# Patient Record
Sex: Female | Born: 1935 | Race: Black or African American | Hispanic: No | Marital: Single | Smoking: Former smoker
Health system: Southern US, Community
[De-identification: ages and names within clinical notes are randomized; demographics above are authoritative.]

## PROBLEM LIST (undated history)

## (undated) DIAGNOSIS — J449 Chronic obstructive pulmonary disease, unspecified: Secondary | ICD-10-CM

## (undated) DIAGNOSIS — Z87891 Personal history of nicotine dependence: Secondary | ICD-10-CM

## (undated) DIAGNOSIS — I10 Essential (primary) hypertension: Secondary | ICD-10-CM

## (undated) DIAGNOSIS — J45909 Unspecified asthma, uncomplicated: Secondary | ICD-10-CM

## (undated) DIAGNOSIS — R0789 Other chest pain: Secondary | ICD-10-CM

---

## 2011-09-08 DIAGNOSIS — R0602 Shortness of breath: Secondary | ICD-10-CM

## 2011-11-06 DIAGNOSIS — R0602 Shortness of breath: Secondary | ICD-10-CM

## 2011-11-07 DIAGNOSIS — R0602 Shortness of breath: Secondary | ICD-10-CM

## 2013-11-17 ENCOUNTER — Encounter (HOSPITAL_COMMUNITY): Payer: Self-pay | Admitting: Nurse Practitioner

## 2013-11-17 ENCOUNTER — Encounter (HOSPITAL_COMMUNITY)
Admission: EM | Disposition: A | Discharge status: Home / Self Care | Payer: Self-pay | Source: Other Acute Inpatient Hospital | Attending: Interventional Cardiology

## 2013-11-17 ENCOUNTER — Inpatient Hospital Stay (HOSPITAL_COMMUNITY)
Admission: EM | Admit: 2013-11-17 | Discharge: 2013-11-19 | DRG: 191 | Disposition: A | Discharge status: Home / Self Care | Payer: Medicare FFS | Source: Other Acute Inpatient Hospital | Attending: Interventional Cardiology | Admitting: Interventional Cardiology

## 2013-11-17 DIAGNOSIS — J441 Chronic obstructive pulmonary disease with (acute) exacerbation: Secondary | ICD-10-CM | POA: Diagnosis present

## 2013-11-17 DIAGNOSIS — Z823 Family history of stroke: Secondary | ICD-10-CM | POA: Diagnosis not present

## 2013-11-17 DIAGNOSIS — K219 Gastro-esophageal reflux disease without esophagitis: Secondary | ICD-10-CM | POA: Diagnosis present

## 2013-11-17 DIAGNOSIS — Z87891 Personal history of nicotine dependence: Secondary | ICD-10-CM

## 2013-11-17 DIAGNOSIS — R062 Wheezing: Secondary | ICD-10-CM | POA: Diagnosis present

## 2013-11-17 DIAGNOSIS — I10 Essential (primary) hypertension: Secondary | ICD-10-CM | POA: Diagnosis present

## 2013-11-17 DIAGNOSIS — J45901 Unspecified asthma with (acute) exacerbation: Secondary | ICD-10-CM | POA: Diagnosis present

## 2013-11-17 DIAGNOSIS — Z8042 Family history of malignant neoplasm of prostate: Secondary | ICD-10-CM | POA: Diagnosis not present

## 2013-11-17 DIAGNOSIS — Z8249 Family history of ischemic heart disease and other diseases of the circulatory system: Secondary | ICD-10-CM | POA: Diagnosis not present

## 2013-11-17 DIAGNOSIS — J449 Chronic obstructive pulmonary disease, unspecified: Secondary | ICD-10-CM

## 2013-11-17 DIAGNOSIS — J440 Chronic obstructive pulmonary disease with acute lower respiratory infection: Secondary | ICD-10-CM | POA: Diagnosis present

## 2013-11-17 DIAGNOSIS — I2510 Atherosclerotic heart disease of native coronary artery without angina pectoris: Secondary | ICD-10-CM | POA: Diagnosis present

## 2013-11-17 DIAGNOSIS — Z880 Allergy status to penicillin: Secondary | ICD-10-CM

## 2013-11-17 DIAGNOSIS — Z9981 Dependence on supplemental oxygen: Secondary | ICD-10-CM | POA: Diagnosis not present

## 2013-11-17 DIAGNOSIS — Z23 Encounter for immunization: Secondary | ICD-10-CM | POA: Diagnosis not present

## 2013-11-17 DIAGNOSIS — R079 Chest pain, unspecified: Secondary | ICD-10-CM | POA: Diagnosis present

## 2013-11-17 DIAGNOSIS — J209 Acute bronchitis, unspecified: Secondary | ICD-10-CM | POA: Diagnosis present

## 2013-11-17 DIAGNOSIS — I200 Unstable angina: Secondary | ICD-10-CM | POA: Insufficient documentation

## 2013-11-17 DIAGNOSIS — R0789 Other chest pain: Secondary | ICD-10-CM

## 2013-11-17 HISTORY — DX: Unspecified asthma, uncomplicated: J45.909

## 2013-11-17 HISTORY — DX: Personal history of nicotine dependence: Z87.891

## 2013-11-17 HISTORY — DX: Other chest pain: R07.89

## 2013-11-17 HISTORY — DX: Chronic obstructive pulmonary disease, unspecified: J44.9

## 2013-11-17 HISTORY — DX: Essential (primary) hypertension: I10

## 2013-11-17 HISTORY — PX: LEFT HEART CATHETERIZATION WITH CORONARY ANGIOGRAM: SHX5451

## 2013-11-17 LAB — POCT ACTIVATED CLOTTING TIME
ACTIVATED CLOTTING TIME: 202 s
ACTIVATED CLOTTING TIME: 163 s

## 2013-11-17 LAB — MRSA PCR SCREENING: MRSA BY PCR: NEGATIVE

## 2013-11-17 LAB — TSH: TSH: 0.779 u[IU]/mL (ref 0.350–4.500)

## 2013-11-17 LAB — TROPONIN I
Troponin I: <0.3 ng/mL (ref <0.30)
Troponin I: <0.3 ng/mL (ref <0.30)

## 2013-11-17 SURGERY — LEFT HEART CATHETERIZATION WITH CORONARY ANGIOGRAM
Anesthesia: LOCAL

## 2013-11-17 MED ORDER — NITROGLYCERIN 1 MG/10 ML FOR IR/CATH LAB
INTRA_ARTERIAL | Status: AC
  Filled 2013-11-17: qty 10

## 2013-11-17 MED ORDER — INDOMETHACIN 50 MG PO CAPS
50.0000 mg | ORAL_CAPSULE | Freq: Two times a day (BID) | ORAL | Status: DC
  Administered 2013-11-17 – 2013-11-18 (×3): 50 mg via ORAL
  Filled 2013-11-17 (×4): qty 1

## 2013-11-17 MED ORDER — PANTOPRAZOLE SODIUM 40 MG PO TBEC
40.0000 mg | DELAYED_RELEASE_TABLET | Freq: Every day | ORAL | Status: DC
  Administered 2013-11-18 – 2013-11-19 (×2): 40 mg via ORAL
  Filled 2013-11-17 (×2): qty 1

## 2013-11-17 MED ORDER — ASPIRIN EC 81 MG PO TBEC
81.0000 mg | DELAYED_RELEASE_TABLET | Freq: Every day | ORAL | Status: DC
Start: 2013-11-18 — End: 2013-11-17

## 2013-11-17 MED ORDER — ALBUTEROL SULFATE (2.5 MG/3ML) 0.083% IN NEBU
3.0000 mL | INHALATION_SOLUTION | RESPIRATORY_TRACT | Status: DC | PRN
  Administered 2013-11-18 (×2): 3 mL via RESPIRATORY_TRACT
  Filled 2013-11-17 (×2): qty 3

## 2013-11-17 MED ORDER — ONDANSETRON HCL 4 MG/2ML IJ SOLN: 4.0000 mg | Freq: Four times a day (QID) | INTRAMUSCULAR | Status: DC | PRN

## 2013-11-17 MED ORDER — LIDOCAINE HCL (PF) 1 % IJ SOLN
INTRAMUSCULAR | Status: AC
  Filled 2013-11-17: qty 30

## 2013-11-17 MED ORDER — INFLUENZA VAC SPLIT QUAD 0.5 ML IM SUSY
0.5000 mL | PREFILLED_SYRINGE | INTRAMUSCULAR | Status: AC
  Administered 2013-11-18: 0.5 mL via INTRAMUSCULAR
  Filled 2013-11-17 (×2): qty 0.5

## 2013-11-17 MED ORDER — CETYLPYRIDINIUM CHLORIDE 0.05 % MT LIQD
7.0000 mL | Freq: Two times a day (BID) | OROMUCOSAL | Status: DC
  Administered 2013-11-17 – 2013-11-19 (×3): 7 mL via OROMUCOSAL

## 2013-11-17 MED ORDER — ACETAMINOPHEN 325 MG PO TABS: 650.0000 mg | ORAL_TABLET | ORAL | Status: DC | PRN

## 2013-11-17 MED ORDER — SODIUM CHLORIDE 0.9 % IJ SOLN
3.0000 mL | Freq: Two times a day (BID) | INTRAMUSCULAR | Status: DC
  Administered 2013-11-18: 10:00:00 via INTRAVENOUS
  Administered 2013-11-18 – 2013-11-19 (×2): 3 mL via INTRAVENOUS

## 2013-11-17 MED ORDER — SODIUM CHLORIDE 0.9 % IJ SOLN: 3.0000 mL | INTRAMUSCULAR | Status: DC | PRN

## 2013-11-17 MED ORDER — VERAPAMIL HCL 2.5 MG/ML IV SOLN
INTRAVENOUS | Status: AC
  Filled 2013-11-17: qty 2

## 2013-11-17 MED ORDER — ATORVASTATIN CALCIUM 10 MG PO TABS
10.0000 mg | ORAL_TABLET | Freq: Every day | ORAL | Status: DC
  Administered 2013-11-17 – 2013-11-18 (×2): 10 mg via ORAL
  Filled 2013-11-17 (×3): qty 1

## 2013-11-17 MED ORDER — FENTANYL CITRATE 0.05 MG/ML IJ SOLN
INTRAMUSCULAR | Status: AC
  Filled 2013-11-17: qty 2

## 2013-11-17 MED ORDER — ATROPINE SULFATE 0.1 MG/ML IJ SOLN
INTRAMUSCULAR | Status: AC
  Filled 2013-11-17: qty 10

## 2013-11-17 MED ORDER — ACETAMINOPHEN 325 MG PO TABS
650.0000 mg | ORAL_TABLET | ORAL | Status: DC | PRN
  Administered 2013-11-17 – 2013-11-18 (×2): 650 mg via ORAL
  Filled 2013-11-17 (×2): qty 2

## 2013-11-17 MED ORDER — HEPARIN (PORCINE) IN NACL 2-0.9 UNIT/ML-% IJ SOLN
INTRAMUSCULAR | Status: AC
  Filled 2013-11-17: qty 1000

## 2013-11-17 MED ORDER — HEPARIN SODIUM (PORCINE) 5000 UNIT/ML IJ SOLN
5000.0000 [IU] | Freq: Three times a day (TID) | INTRAMUSCULAR | Status: DC
  Administered 2013-11-17 – 2013-11-19 (×5): 5000 [IU] via SUBCUTANEOUS
  Filled 2013-11-17 (×8): qty 1

## 2013-11-17 MED ORDER — MIDAZOLAM HCL 2 MG/2ML IJ SOLN
INTRAMUSCULAR | Status: AC
  Filled 2013-11-17: qty 2

## 2013-11-17 MED ORDER — NITROGLYCERIN 0.4 MG SL SUBL: 0.4000 mg | SUBLINGUAL_TABLET | SUBLINGUAL | Status: DC | PRN

## 2013-11-17 MED ORDER — SODIUM CHLORIDE 0.9 % IV SOLN
INTRAVENOUS | Status: DC
  Administered 2013-11-17 (×2): via INTRAVENOUS

## 2013-11-17 MED ORDER — SODIUM CHLORIDE 0.9 % IV SOLN: 250.0000 mL | INTRAVENOUS | Status: DC | PRN

## 2013-11-17 MED ORDER — ASPIRIN 81 MG PO CHEW
81.0000 mg | CHEWABLE_TABLET | Freq: Every day | ORAL | Status: DC
  Administered 2013-11-17 – 2013-11-19 (×3): 81 mg via ORAL
  Filled 2013-11-17 (×3): qty 1

## 2013-11-17 NOTE — Progress Notes (Signed)
eLink Physician-Brief Progress Note Patient Name: MITCHELLE SULTAN DOB: [birth year] MRN: [id number]   Date of Service  11/17/2013  HPI/Events of Note  Pt seen post cath . Neg cath, prob pericarditis  eICU Interventions  Pt stable from elink standpoint      Intervention Category Evaluation Type: New Patient Evaluation  Shan Levans 11/17/2013, 5:44 PM

## 2013-11-17 NOTE — Progress Notes (Signed)
Right femoral arterial sheath removed at 18:30, pressure held for 20 minutes until hemostasis was achieved; site level 0 & pedal pulses are equal/palpabable +1.  Patient instructed to keep her right leg straight, brace her right groin if she needs to cough/sneeze/laugh and to keep her head on her pillow. Will continue to monitor closely. [employer], Mitzi Hansen

## 2013-11-17 NOTE — H&P (Signed)
Patient ID: Olivia Russo MRN: [id number], DOB/AGE: [birth year]   Admit date: 11/17/2013  Primary Physician: Dr. Martie Lee, [place]. Primary Cardiologist: new - seen by Catalina Gravel, MD - should f/u in [place].  Pt. Profile:  78 y/o female w/o prior cardiac hx who presented today with diffuse ST elevation and chest pain.  Problem List  Past Medical History  Diagnosis Date  . HTN (hypertension)   . COPD (chronic obstructive pulmonary disease)     a. On home O2.  . Asthma   . History of tobacco abuse     a. 40-50 pack years, quit ~ 2009.    No past surgical history on file.   Allergies  Allergies no known allergies  HPI  78 y/o female with a h/o HTN, remote tob abuse, and COPD.  She has no prior cardiac hx.  She was in her usoh until this AM at about 5:30, when she awoke with chest pain.  This persisted throughout the day and she called EMS this afternoon.  She was taken to the ED @ [facility] and upon review of ECG, it was noted that she had somewhat diffuse ST elevation and hyperacute T waves.  This differed from prior ECG's and a Code STEMI was activated.  She was tx to [facility] for further eval and cont to c/o 5/10 chest pain.  Cath ongoing.  Home Medications  BP med and asthma med - pharmacy review pending.  Family History  Family History  Problem Relation Age of Onset  . Prostate cancer Father     died in either 37's or 56's.  Marland Kitchen Heart attack Father   . Stroke Mother     deceased - ? age.   Social History  History   Social History  . Marital Status: Single    Spouse Name: N/A    Number of Children: N/A  . Years of Education: N/A   Occupational History  . Not on file.   Social History Main Topics  . Smoking status: Former Smoker -- 1.00 packs/day for 50 years    Types: Cigarettes  . Smokeless tobacco: Not on file  . Alcohol Use: No  . Drug Use: No  . Sexual Activity: Not on file   Other Topics Concern  . Not on file   Social History  Narrative   Lives in [place], [place] with her dtr.  Does not routinely exercise.    Review of Systems General:  No chills, fever, night sweats or weight changes.  Cardiovascular:  +++ chest pain, +++ dyspnea on exertion on home O2, no edema, orthopnea, palpitations, paroxysmal nocturnal dyspnea. Dermatological: No rash, lesions/masses Respiratory: No cough, dyspnea Urologic: No hematuria, dysuria Abdominal:   No nausea, vomiting, diarrhea, bright red blood per rectum, melena, or hematemesis Neurologic:  No visual changes, wkns, changes in mental status. All other systems reviewed and are otherwise negative except as noted above.  Physical Exam  Afebrile, 87/54, 82, 95% 2lpm.  General: Pleasant, NAD Psych: Normal affect. Neuro: Alert and oriented X 3. Moves all extremities spontaneously. HEENT: Normal  Neck: Supple without bruits or JVD. Lungs:  Resp regular and unlabored, CTA. Heart: RRR no s3, s4, or murmurs. Abdomen: Soft, non-tender, non-distended, BS + x 4.  Extremities: No clubbing, cyanosis or edema. DP/PT/Radials 1+ and equal bilaterally.  Labs  Wbc 13.7, H/H 13.7/42.1, Plt 228, Na 137, K 4.5, Cl 100, CO2 28, BUN 19, Creat 0.97, Gluc 126, Mg 2.1, Trop i < 0.01.  Radiology/Studies  Stable moderate CM. Minimal R basilar atx.  No acute CP dzs.  ECG  RSR, 60, diffuse 1mm J point elevation with convex ST elevation in I, II, III, aVF, V3-V6 with TWI in V1 and V2.  ASSESSMENT AND PLAN  1.  Acute Coronary Syndrome/Midsternal Chest Pain:  Pt presents on tx from [facility] ED with diffuse ST elevation and ongoing chest pain. Emergent cardiac cath preliminarily shows nonobstructive CAD adn nl LV fxn.  With ongoing pain will admit to stepdown and continue to cycle CE.  Will start indocin for possible pericarditis.  Add asa, statin.  Hold off on bb with h/o COPD/Asthma req home O2.  2.  COPD/Asthma:  Will need home med rec for inhalers.  No active wheezing.  3.  HTN:  Pressures have  been soft.  Follow.  4.  Lipids:  Check lipids/lft's.  Signed, Nicolasa Ducking, NP 11/17/2013, 4:34 PM  I have examined the patient and reviewed assessment and plan and discussed with patient.  Agree with above as stated.  Patient seemed to 2 EKG changes. Compared to ECG from earlier this month, she does have ST elevations more pronounced in the inferior and lateral leads. Due to the ongoing chest pain, inferior ST elevation MI was suspected. Coronary arteries did not since significant obstructive disease as noted above. She is having ongoing pain. No evidence of aortic dissection. Will treat for pericarditis.  Rishaan Gunner S.

## 2013-11-17 NOTE — CV Procedure (Signed)
    PROCEDURE:  Left heart catheterization with selective coronary angiography, left ventriculogram.  INDICATIONS:  Suspected inferior ST elevation MI  The risks, benefits, and details of the procedure were explained to the patient.  The patient verbalized understanding and wanted to proceed.  Informed written consent was obtained.  PROCEDURE TECHNIQUE:  After Xylocaine anesthesia a 68F sheath was placed in the right femoral artery with a single anterior needle wall stick.   Left coronary angiography was done using a Judkins L4 guide catheter.  Right coronary angiography was done using a Judkins R4 guide catheter.   Further images of the left coronary were obtained using the JL4 catheter. Left ventriculography was done using a pigtail catheter. Manual compression will be used for hemostasis.   CONTRAST:  Total of 75 cc.  COMPLICATIONS:  None.    HEMODYNAMICS:  Aortic pressure was 90/48; LV pressure was 90/3; LVEDP 10.  There was no gradient between the left ventricle and aorta.    ANGIOGRAPHIC DATA:   The left main coronary artery is widely patent.  The left anterior descending artery is a large vessel which wraps around the apex. There is mild, proximal disease. There are several very small diagonal vessels which are patent.  The left circumflex artery is a medium size vessel which is widely patent. The OM1 is widely patent. The remainder of the circumflex is also widely patent. There are only minimal irregularities in the circumflex system.  There is a large ramus vessel which extends across the lateral wall and is widely patent.  The right coronary artery is a large, dominant vessel. There is an early bifurcation of the posterior lateral artery and posterior descending artery. Both branches are widely patent. At the end of the posterior descending artery, there appears to be a fistula with the left ventricle, and some collateral filling of the left system vessels.  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  LEFT VENTRICULOGRAM:  Left ventricular angiogram was done in the 30 RAO projection and revealed normal left ventricular wall motion and systolic function with an estimated ejection fraction of 65 %.  LVEDP was 10 mmHg.    IMPRESSIONS:  1. Normal left main coronary artery. 2. Mild disease in the proximal left anterior descending artery. 3. Widely patent left circumflex artery, ramus and its branches. 4. Widely patent right coronary artery. 5. Normal left ventricular systolic function.  LVEDP 10 mmHg.  Ejection fraction 65%. 6.  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  RECOMMENDATION:  Her chest pain does not appear to be from ischemia. We'll try NSAID for suspected pericarditis. She does have a diffuse pattern of ST elevation with the exception of the one and V2. This is different from prior ECG. Continue aggressive preventive therapy.  Possible discharge tomorrow if her symptoms are better controlled. Consider followup in our [place] the office since that is where she is from.

## 2013-11-18 ENCOUNTER — Encounter (HOSPITAL_COMMUNITY): Payer: Self-pay | Admitting: Cardiology

## 2013-11-18 ENCOUNTER — Inpatient Hospital Stay (HOSPITAL_COMMUNITY): Payer: Medicare FFS

## 2013-11-18 DIAGNOSIS — J441 Chronic obstructive pulmonary disease with (acute) exacerbation: Secondary | ICD-10-CM

## 2013-11-18 DIAGNOSIS — J209 Acute bronchitis, unspecified: Secondary | ICD-10-CM

## 2013-11-18 DIAGNOSIS — I369 Nonrheumatic tricuspid valve disorder, unspecified: Secondary | ICD-10-CM

## 2013-11-18 DIAGNOSIS — R062 Wheezing: Secondary | ICD-10-CM | POA: Diagnosis present

## 2013-11-18 DIAGNOSIS — J449 Chronic obstructive pulmonary disease, unspecified: Secondary | ICD-10-CM

## 2013-11-18 LAB — CBC
HCT: 34.7 % — ABNORMAL LOW (ref 36.0–46.0)
HEMOGLOBIN: 11.7 g/dL — AB (ref 12.0–15.0)
MCH: 29.6 pg (ref 26.0–34.0)
MCHC: 33.7 g/dL (ref 30.0–36.0)
MCV: 87.8 fL (ref 78.0–100.0)
Platelets: 195 10*3/uL (ref 150–400)
RBC: 3.95 MIL/uL (ref 3.87–5.11)
RDW: 14.6 % (ref 11.5–15.5)
WBC: 6.4 10*3/uL (ref 4.0–10.5)

## 2013-11-18 LAB — COMPREHENSIVE METABOLIC PANEL
ALT: 9 U/L (ref 0–35)
AST: 12 U/L (ref 0–37)
Albumin: 2.9 g/dL — ABNORMAL LOW (ref 3.5–5.2)
Alkaline Phosphatase: 47 U/L (ref 39–117)
Anion gap: 8 (ref 5–15)
BUN: 20 mg/dL (ref 6–23)
CALCIUM: 8.3 mg/dL — AB (ref 8.4–10.5)
CHLORIDE: 110 meq/L (ref 96–112)
CO2: 24 meq/L (ref 19–32)
CREATININE: 0.87 mg/dL (ref 0.50–1.10)
GFR calc Af Amer: 73 mL/min — ABNORMAL LOW (ref >90)
GFR, EST NON AFRICAN AMERICAN: 63 mL/min — AB (ref >90)
Glucose, Bld: 96 mg/dL (ref 70–99)
Potassium: 4.6 mEq/L (ref 3.7–5.3)
Sodium: 142 mEq/L (ref 137–147)
Total Bilirubin: 0.5 mg/dL (ref 0.3–1.2)
Total Protein: 6 g/dL (ref 6.0–8.3)

## 2013-11-18 LAB — TROPONIN I: Troponin I: <0.3 ng/mL (ref <0.30)

## 2013-11-18 LAB — LIPID PANEL
Cholesterol: 146 mg/dL (ref 0–200)
HDL: 75 mg/dL (ref >39)
LDL CALC: 55 mg/dL (ref 0–99)
Total CHOL/HDL Ratio: 1.9 RATIO
Triglycerides: 78 mg/dL (ref <150)
VLDL: 16 mg/dL (ref 0–40)

## 2013-11-18 LAB — HEMOGLOBIN A1C
HEMOGLOBIN A1C: 6.2 % — AB (ref <5.7)
Mean Plasma Glucose: 131 mg/dL — ABNORMAL HIGH (ref <117)

## 2013-11-18 MED ORDER — DOXYCYCLINE HYCLATE 100 MG PO TABS
100.0000 mg | ORAL_TABLET | Freq: Two times a day (BID) | ORAL | Status: DC
  Filled 2013-11-18 (×2): qty 1

## 2013-11-18 MED ORDER — ALBUTEROL SULFATE (2.5 MG/3ML) 0.083% IN NEBU: 2.5000 mg | INHALATION_SOLUTION | RESPIRATORY_TRACT | Status: DC | PRN

## 2013-11-18 MED ORDER — METHYLPREDNISOLONE SODIUM SUCC 125 MG IJ SOLR
125.0000 mg | Freq: Once | INTRAMUSCULAR | Status: AC
  Administered 2013-11-18: 125 mg via INTRAVENOUS
  Filled 2013-11-18: qty 2

## 2013-11-18 MED ORDER — PREDNISONE 20 MG PO TABS
40.0000 mg | ORAL_TABLET | Freq: Every day | ORAL | Status: DC
  Filled 2013-11-18: qty 2

## 2013-11-18 MED ORDER — ARFORMOTEROL TARTRATE 15 MCG/2ML IN NEBU
15.0000 ug | INHALATION_SOLUTION | Freq: Two times a day (BID) | RESPIRATORY_TRACT | Status: DC
  Administered 2013-11-18 – 2013-11-19 (×2): 15 ug via RESPIRATORY_TRACT
  Filled 2013-11-18 (×6): qty 2

## 2013-11-18 MED ORDER — DOXYCYCLINE HYCLATE 100 MG PO TABS: 100.0000 mg | ORAL_TABLET | Freq: Two times a day (BID) | ORAL | Status: DC

## 2013-11-18 MED ORDER — ALBUTEROL SULFATE (2.5 MG/3ML) 0.083% IN NEBU
2.5000 mg | INHALATION_SOLUTION | RESPIRATORY_TRACT | Status: DC
  Administered 2013-11-18 (×4): 2.5 mg via RESPIRATORY_TRACT
  Filled 2013-11-18 (×3): qty 3

## 2013-11-18 MED ORDER — BUDESONIDE 0.5 MG/2ML IN SUSP
0.5000 mg | Freq: Two times a day (BID) | RESPIRATORY_TRACT | Status: DC
  Administered 2013-11-18 – 2013-11-19 (×2): 0.5 mg via RESPIRATORY_TRACT
  Filled 2013-11-18 (×3): qty 4
  Filled 2013-11-18 (×3): qty 2

## 2013-11-18 MED ORDER — TRIAMTERENE-HCTZ 75-50 MG PO TABS
1.0000 | ORAL_TABLET | Freq: Every day | ORAL | Status: DC
  Administered 2013-11-19: 1 via ORAL
  Filled 2013-11-18: qty 1

## 2013-11-18 MED ORDER — SODIUM CHLORIDE 0.9 % IV BOLUS (SEPSIS)
500.0000 mL | Freq: Once | INTRAVENOUS | Status: AC
  Administered 2013-11-18: 500 mL via INTRAVENOUS

## 2013-11-18 MED ORDER — PREDNISONE 20 MG PO TABS: ORAL_TABLET | ORAL | Status: DC

## 2013-11-18 MED ORDER — BUDESONIDE 0.25 MG/2ML IN SUSP: 0.2500 mg | Freq: Two times a day (BID) | RESPIRATORY_TRACT | Status: DC

## 2013-11-18 MED ORDER — TRAZODONE 25 MG HALF TABLET
25.0000 mg | ORAL_TABLET | Freq: Every evening | ORAL | Status: DC | PRN
  Filled 2013-11-18: qty 1

## 2013-11-18 MED ORDER — DOXYCYCLINE HYCLATE 100 MG PO TABS
100.0000 mg | ORAL_TABLET | Freq: Two times a day (BID) | ORAL | Status: DC
  Administered 2013-11-18: 100 mg via ORAL
  Filled 2013-11-18 (×2): qty 1

## 2013-11-18 MED ORDER — ARFORMOTEROL TARTRATE 15 MCG/2ML IN NEBU: 15.0000 ug | INHALATION_SOLUTION | Freq: Two times a day (BID) | RESPIRATORY_TRACT | Status: DC

## 2013-11-18 MED ORDER — ATORVASTATIN CALCIUM 10 MG PO TABS: 10.0000 mg | ORAL_TABLET | Freq: Every day | ORAL | Status: AC

## 2013-11-18 MED ORDER — FAMOTIDINE 20 MG PO TABS
20.0000 mg | ORAL_TABLET | Freq: Two times a day (BID) | ORAL | Status: DC
Start: 2013-11-18 — End: 2013-11-19
  Administered 2013-11-18 – 2013-11-19 (×2): 20 mg via ORAL
  Filled 2013-11-18 (×4): qty 1

## 2013-11-18 NOTE — Discharge Summary (Signed)
Physician Discharge Olivia Russo Patient ID: Olivia Russo MRN: [id number] DOB/AGE: 76-May-1937 76 y.o.  Admit date: 11/17/2013 Discharge date: 11/18/2013  Discharge Diagnoses:  Principal Problem:   Midsternal chest pain, negative MI, non obstructive CAD on cardiac cath 11/17/13, felt secondary to bronchitis Active Problems:   Acute bronchitis   HTN (hypertension)   COPD exacerbation   Wheezing   Discharged Condition: good  Procedures: 11/17/13 cardiac cath with mild LAD disease, EF 65%, non obstructive CAD.    Hospital Course:  78 y/o female with a h/o HTN, remote tob abuse, and COPD. She has no prior cardiac hx. She was in her usoh until this 11/17/13 at about 5:30 AM, when she awoke with chest pain. This persisted throughout the day and she called EMS this afternoon. She was taken to the ED @ [facility] and upon review of ECG, it was noted that she had somewhat diffuse ST elevation and hyperacute T waves. This differed from prior ECG's and a Code STEMI was activated. She was tx to [facility] for further eval and cont to c/o 5/10 chest pain. She underwent cardiac cath and found to have non obstructive CAD.  Troponin Is negative X 3.  EKG on 11/18/13 normal.    She was wheezing on AM exam and pul consult was obtained.  + for acute bronchitis.  She was placed on steroid dosing pack and antibiotics.  It was believed her chest pain was related to acute bronchitis.  We did add lipitor to pt's medications with her mild CAD.  She will follow up with Dr. Robynn Pane in [place] her PCP.  She was seen and evaluated by Dr. Elease Hashimoto and found stable for discharge.  Pulmonary agreed she was stable for discharge.  Dr. Molli Knock saw and evaluated her.  We will not make appt for cardillogy follow up - though we would be available for any consult from Dr. Robynn Pane.     Consults: pulmonary/intensive care  Significant Diagnostic Studies:  BMET    Component Value Date/Time   NA 142 11/18/2013 0540   K 4.6  11/18/2013 0540   CL 110 11/18/2013 0540   CO2 24 11/18/2013 0540   GLUCOSE 96 11/18/2013 0540   BUN 20 11/18/2013 0540   CREATININE 0.87 11/18/2013 0540   CALCIUM 8.3* 11/18/2013 0540   GFRNONAA 63* 11/18/2013 0540   GFRAA 73* 11/18/2013 0540    CBC    Component Value Date/Time   WBC 6.4 11/18/2013 0540   RBC 3.95 11/18/2013 0540   HGB 11.7* 11/18/2013 0540   HCT 34.7* 11/18/2013 0540   PLT 195 11/18/2013 0540   MCV 87.8 11/18/2013 0540   MCH 29.6 11/18/2013 0540   MCHC 33.7 11/18/2013 0540   RDW 14.6 11/18/2013 0540    Troponin <0.30 X 3 TSH  0.779  PCXR: PORTABLE CHEST - 1 VIEW  COMPARISON: Radiograph 11/17/2013, CT 03/03/2013  FINDINGS:  Normal mediastinum and cardiac silhouette. Normal pulmonary  vasculature. No evidence of effusion, infiltrate, or pneumothorax.  No acute bony abnormality.  IMPRESSION:  No acute cardiopulmonary process.   EKG:  HR 57  Sinus bradycardia with Premature atrial complexes Otherwise normal ECG    Discharge Exam: Blood pressure 82/52, pulse 53, temperature 97.7 F (36.5 C), temperature source Oral, resp. rate 19, height  (1.575 m), weight 150 lb 2.1 oz (68.1 kg), SpO2 100.00%.    Disposition: Home      Discharge Instructions   Discharge instructions  Complete by:  As directed   1. Review your medications carefully as they have changed 2. Complete your antibiotics as prescribed for COPD exacerbation 3.  Follow up with Dr. Robynn Pane within one week 4. Stay on zantac as prescribed 5. Use Albuterol nebulizer every 4 hours as needed for wheezing or shortness of breath.  6. Wear your oxygen at 2L contiuously 7. Complete your prednisone taper as prescribed            Medication List         arformoterol 15 MCG/2ML Nebu  Commonly known as:  BROVANA  Take 15 mcg by nebulization 2 (two) times daily.     atorvastatin 10 MG tablet  Commonly known as:  LIPITOR  Take 1 tablet (10 mg total) by mouth daily at 6 PM.     budesonide 0.25  MG/2ML nebulizer solution  Commonly known as:  PULMICORT  Take 0.25 mg by nebulization 2 (two) times daily.     doxycycline 100 MG tablet  Commonly known as:  VIBRA-TABS  Take 1 tablet (100 mg total) by mouth every 12 (twelve) hours.     predniSONE 20 MG tablet  Commonly known as:  DELTASONE  2 tabs for 2 days, then 1 tab for 2 days, then 1/2 tab for 2 days, then stop     PROAIR HFA 108 (90 BASE) MCG/ACT inhaler  Generic drug:  albuterol  Inhale 2 puffs into the lungs every 6 (six) hours as needed for wheezing or shortness of breath.     albuterol (2.5 MG/3ML) 0.083% nebulizer solution  Commonly known as:  PROVENTIL  Take 3 mLs (2.5 mg total) by nebulization every 4 (four) hours as needed for wheezing or shortness of breath.     ranitidine 300 MG tablet  Commonly known as:  ZANTAC  Take 300 mg by mouth daily.     traZODone 50 MG tablet  Commonly known as:  DESYREL  Take 25 mg by mouth at bedtime as needed for sleep.     triamterene-hydrochlorothiazide 75-50 MG per tablet  Commonly known as:  MAXZIDE  Take 1 tablet by mouth daily.       Follow-up Information   Schedule an appointment as soon as possible for a visit to follow up. (with Dr. Robynn Pane in [place] for follow up within one week.)        Discharge Instructions:Heart Healthy diet  Call [facility] [phone] if any bleeding, swelling or drainage at cath site.  May shower, no tub baths for 48 hours for groin sticks. No lifting over 5 pounds for 3 days.  No driving for 3 days.    You do have mild coronary artery disease.  We recommend Lipitor to stabilize and CAD.  Your PCP will follow.     Signed: Leone Brand Nurse Practitioner-Certified [facility]: [facility] 11/18/2013, 12:06 PM  Time spent on discharge : >30 min minutes.     Attending Note:   Patient was not able to find a ride home to [place].  Will keep her overnight. DC to home tomorrow   Vesta Mixer, Montez Hageman., MD,  [facility] 11/18/2013, 12:50 PM [address]  [address] Office [phone] Pager [phone]

## 2013-11-18 NOTE — Progress Notes (Signed)
Patient's SBP 70-80's. Patient is resting, awakens easily. Skin warm and dry. Denies symptoms. Dr. Duke Salvia notified. 500 ml NS bolus ordered. Infusion started. Will continue to monitor.

## 2013-11-18 NOTE — Progress Notes (Signed)
Pt asked to call and informed daughter, Judi Cong that she is tx'd.  Pt's daughter informed.  Also pt was able to talk with daughter over the phone.  Amanda Pea, Charity fundraiser.

## 2013-11-18 NOTE — Consult Note (Signed)
Name: Olivia Russo MRN: [id number] DOB: 06-25-1935    ADMISSION DATE:  11/17/2013 CONSULTATION DATE:  11/18/13  REFERRING MD :  Dr. Eldridge Dace PRIMARY SERVICE:  Cardiology   CHIEF COMPLAINT:  Chest pain   BRIEF PATIENT DESCRIPTION: 78 y/o F admitted with diffuse ST elevation & chest pain.  She underwent LHC on 9/20 with minimal CAD. Working diagnosis of pericarditis post LHC.  Noted to have wheezing on exam 9/21.  PCCM consulted for pulmonary evaluation.   SIGNIFICANT EVENTS: 9/20  Admit with chest pain.  LHC with minimal CAD 9/21  Wheezing on exam, PCCM consulted   STUDIES:  9/20  LHC > nml L main, mild disease in proximal LAD, patent L circ, patent RCA, nml EF 65%   ANTIBIOTICS:   HISTORY OF PRESENT ILLNESS:  78 y/o F, former smoker, with a PMH of HTN, GERD, 2L O2 dependent COPD admitted with diffuse ST elevation & chest pain.  Patient reports she felt well on Saturday 9/19 but woke on Sunday morning early with diffuse chest pain.  Patient is concerned she at spaghetti and it may be heartburn related as she is not supposed to eat this with hx of GERD.  She describes the pain as mid-sternal and moved across the chest.  Daughter attempted zantac with some improvement in symptoms but she noted her mother to be sweaty and short of breath prompting her to call EMS.  ER evaluation in [place] noted her to have an EKG with concerns for diffuse ST elevation, Troponin 0.01, WBC 13.7, sr cr 0.97, K 4.5, and a chest xray with a reading of mild R Basilar atelectasis.  Patient was transferred to [facility] as an acute STEMI and taken directly for [facility] which demonstrated minimal CAD. Working diagnosis of pericarditis post LHC.  Noted to have wheezing on exam 9/21 & PCCM consulted for pulmonary evaluation.   Pulmonary Hx: Worked in tobacco fields Former Smoker Age [age], 1ppd Takes prednisone ~ 2 x per year for COPD exacerbations.   PAST MEDICAL HISTORY :  Past Medical History  Diagnosis Date  . HTN  (hypertension)   . COPD (chronic obstructive pulmonary disease)     a. On home O2.  . Asthma   . History of tobacco abuse     a. 40-50 pack years, quit ~ 2009.  Marland Kitchen Intermediate coronary syndrome    History reviewed. No pertinent past surgical history. Prior to Admission medications   Medication Sig Start Date End Date Taking? Authorizing Provider  PROAIR HFA 108 (90 BASE) MCG/ACT inhaler Inhale 2 puffs into the lungs every 6 (six) hours as needed. 11/02/13   Historical Provider, MD  ranitidine (ZANTAC) 300 MG tablet Take 300 mg by mouth daily. 11/07/13   Historical Provider, MD  traZODone (DESYREL) 50 MG tablet Take 25 mg by mouth at bedtime as needed. 10/03/13   Historical Provider, MD   Allergies  Allergen Reactions  . Penicillins Hives    FAMILY HISTORY:  Family History  Problem Relation Age of Onset  . Prostate cancer Father     died in either 87's or 60's.  Marland Kitchen Heart attack Father   . Stroke Mother     deceased - ? age.   SOCIAL HISTORY:  reports that she has quit smoking. Her smoking use included Cigarettes. She has a 12.5 pack-year smoking history. She does not have any smokeless tobacco history on file. She reports that she does not drink alcohol or use illicit drugs.  REVIEW OF SYSTEMS:  Constitutional: Negative for fever, chills, weight loss, malaise/fatigue and diaphoresis.  HENT: Negative for hearing loss, ear pain, nosebleeds, congestion, sore throat, neck pain, tinnitus and ear discharge.   Eyes: Negative for blurred vision, double vision, photophobia, pain, discharge and redness.  Respiratory: Negative for hemoptysis, sputum production, shortness of breath, and stridor.  + for chronic cough, non-productive, wheezing.  Denies SOB at present.  Cardiovascular: Negative for chest pain, palpitations, orthopnea, claudication, leg swelling and PND.  Gastrointestinal: Negative for heartburn, nausea, vomiting, abdominal pain, diarrhea, constipation, blood in stool and melena.    Genitourinary: Negative for dysuria, urgency, frequency, hematuria and flank pain.  Musculoskeletal: Negative for myalgias, back pain, joint pain and falls.  Skin: Negative for itching and rash.  Neurological: Negative for dizziness, tingling, tremors, sensory change, speech change, focal weakness, seizures, loss of consciousness, weakness and headaches.  Endo/Heme/Allergies: Negative for environmental allergies and polydipsia. Does not bruise/bleed easily.  SUBJECTIVE: Pt reports resolved chest pain.  Feels like she is wheezing.    VITAL SIGNS: Temp:  [97.7 F (36.5 C)-98.8 F (37.1 C)] 97.7 F (36.5 C) (09/21 0730) Pulse Rate:  [50-102] 53 (09/21 0800) Resp:  [10-23] 19 (09/21 0800) BP: (74-110)/(36-84) 82/52 mmHg (09/21 0800) SpO2:  [92 %-100 %] 100 % (09/21 0800) Weight:  [144 lb 2.9 oz (65.4 kg)-150 lb 2.1 oz (68.1 kg)] 150 lb 2.1 oz (68.1 kg) (09/21 0500)  PHYSICAL EXAMINATION: General:  wdwn elderly female in NAD  Neuro:  AAOx4, speech clear, MAE HEENT:  Mm pink/mosit, no jvd Cardiovascular:  s1s2 rrr, no m/r/g Lungs:  resp's even/non-labored on 2L O2 (baseline), lungs bilaterally with wheezing Abdomen:  Round/soft, bsx4 active  Musculoskeletal:  No acute deformities  Skin:  Warm/dry, no edema    Recent Labs Lab 11/18/13 0540  NA 142  K 4.6  CL 110  CO2 24  BUN 20  CREATININE 0.87  GLUCOSE 96    Recent Labs Lab 11/18/13 0540  HGB 11.7*  HCT 34.7*  WBC 6.4  PLT 195   No results found.  ASSESSMENT / PLAN:  Presumed COPD  Asthma  Former Tobacco Abuse Chest Pain   78 y/o F, former smoker, with presumed COPD (no documented PFT's) admitted with chest pain.  LHC with minimal CAD.  At baseline, she is on brovana, budesonide and albuterol which were not on her medication list or continued on adimt.  Pt developed bronchospasm early am 9/21 off baseline medications.  Likely component of AECOPD +/- GERD.  She confirms that she does wheeze at baseline and feels  that this is no different that her norm.   Plan: -CXR reviewed 9/21, no acute infiltrate -Prednisone taper to off -Doxycycline 100 mg BID x 5 days -Albuterol neb q4 PRN -Follow up with Dr. Robynn Pane in [place] -Continue baseline 2L O2 -Continue zantac at discharge   Canary Brim, NP-C [facility] Pgr: [phone] or [phone]  Wheezing at baseline, no current complaints of SOB, on 2L as in home.  Recommend doxy and steroids, bronchodilators as above, recommend restarting diuretics.  Ok to discharge from a pulmonary standpoint.  PCCM will sign off, please call back if needed.  Patient seen and examined, agree with above note.  I dictated the care and orders written for this patient under my direction.  Alyson Reedy, MD [phone]  11/18/2013, 10:15 AM

## 2013-11-18 NOTE — Progress Notes (Signed)
Utilization Review Completed.Olivia Russo T9/21/2015  

## 2013-11-18 NOTE — Progress Notes (Signed)
INITIAL NUTRITION ASSESSMENT  DOCUMENTATION CODES Per approved criteria  -Not Applicable   INTERVENTION: Ensure Complete po BID, each supplement provides 350 kcal and 13 grams of protein  NUTRITION DIAGNOSIS: Unintentional weight loss related to chronic disease as evidenced by per pt report.   Goal: Pt to meet >/= 90% of their estimated nutrition needs   Monitor:  PO intake, weight trend, labs, supplement acceptance  Reason for Assessment: Pt identified as at nutrition risk on the Malnutrition Screen Tool  78 y.o. female  Admitting Dx: Chest pain  ASSESSMENT: Pt admitted with chest pain and ST elevation.  Pt reports weight loss PTA but unable to state how much she usually weighs. Pt lives with daughter who cooks for pt. Pt reports eating 3 meals per day.  Pt did seem a bit confused. Nutrition-focused physical exam WDL.   Height: Ht Readings from Last 1 Encounters:  11/17/13  (1.575 m)    Weight: Wt Readings from Last 1 Encounters:  11/18/13 150 lb 2.1 oz (68.1 kg)    Ideal Body Weight: 50 kg   % Ideal Body Weight: 136%  Wt Readings from Last 10 Encounters:  11/18/13 150 lb 2.1 oz (68.1 kg)  11/18/13 150 lb 2.1 oz (68.1 kg)    Usual Body Weight: unknown  % Usual Body Weight: -  BMI:  Body mass index is 27.45 kg/(m^2).  Estimated Nutritional Needs: Kcal: 1500-1700 Protein: 75-85 grams Fluid: > 1.5 L/day  Skin: WDL  Diet Order: Cardiac  EDUCATION NEEDS: -No education needs identified at this time   Intake/Output Summary (Last 24 hours) at 11/18/13 1128 Last data filed at 11/18/13 1000  Gross per 24 hour  Intake   2685 ml  Output    675 ml  Net   2010 ml    Last BM: 9/21   Labs:   Recent Labs Lab 11/18/13 0540  NA 142  K 4.6  CL 110  CO2 24  BUN 20  CREATININE 0.87  CALCIUM 8.3*  GLUCOSE 96    CBG (last 3)  No results found for this basename: GLUCAP,  in the last 72 hours  Scheduled Meds: . albuterol  2.5 mg  Nebulization Q4H  . antiseptic oral rinse  7 mL Mouth Rinse BID  . arformoterol  15 mcg Nebulization BID  . aspirin  81 mg Oral Daily  . atorvastatin  10 mg Oral q1800  . budesonide  0.5 mg Nebulization BID  . doxycycline  100 mg Oral Q12H  . heparin  5,000 Units Subcutaneous 3 times per day  . indomethacin  50 mg Oral BID WC  . Influenza vac split quadrivalent PF  0.5 mL Intramuscular Tomorrow-1000  . pantoprazole  40 mg Oral Q0600  . [START ON 11/19/2013] predniSONE  40 mg Oral Q breakfast  . sodium chloride  3 mL Intravenous Q12H    Continuous Infusions: . sodium chloride 100 mL/hr at 11/17/13 1935    Past Medical History  Diagnosis Date  . HTN (hypertension)   . COPD (chronic obstructive pulmonary disease)     a. On home O2.  . Asthma   . History of tobacco abuse     a. 40-50 pack years, quit ~ 2009.  Marland Kitchen Intermediate coronary syndrome     History reviewed. No pertinent past surgical history.  Kendell Bane RD, LDN, CNSC [phone] Pager [phone] After Hours Pager

## 2013-11-18 NOTE — Progress Notes (Signed)
11/18/13 0311  Vitals  BP ! 94/53 mmHg  MAP (mmHg) 66  Pulse Rate ! 50  ECG Heart Rate ! 57  Resp 13  Oxygen Therapy  SpO2 100 %  bolus complete. Pt resting. No signs of distress. Will monitor.

## 2013-11-18 NOTE — Progress Notes (Signed)
Pt transfer to floor via w/c.   Arrived to floor .   A80x2 (place and date).  Oriented to room, call bell at bedside Denies pain or discomfort.  Will continue to monitor.  Amanda Pea, Charity fundraiser.

## 2013-11-18 NOTE — Progress Notes (Signed)
Chaplain returned to complete consult for prayer as requested by pt. Pt very energetic and lively through the duration of the visit. Pt disclosed her medical history, prognosis and hopes of being discharged tomorrow. Pt expressed that she is a woman of strong faith and hopes that God see her through her medical issues once again. Cites God and good care from HCPs as the reasons for her current well-being.  Pt has a daughter that she lives with as the main contact for her.   She requested another visit in the morning.   Page if needed.   Olivia Russo 11/18/2013 4:56 PM

## 2013-11-18 NOTE — Progress Notes (Signed)
1005 Cardiac Rehab was ordered on admission but according to MD note today pt's issues are pulmonary. No active cardiac issues. Reorder [place] if status changes and we need to see pt.  Signing off. Luetta Nutting RN BSN 11/18/2013 10:06 AM

## 2013-11-18 NOTE — Progress Notes (Signed)
Chaplain went to visit pt to complete consult. Pt was asleep. Will return before 5pm.  Gala Romney 11/18/2013 3:38 PM

## 2013-11-18 NOTE — Discharge Instructions (Signed)
Call [facility] [phone] if any bleeding, swelling or drainage at cath site.  May shower, no tub baths for 48 hours for groin sticks. No lifting over 5 pounds for 3 days.  No driving for 3 days.    You do have mild coronary artery disease.  We recommend Lipitor to stabilize and CAD.  Your PCP will follow.  Heart Healthy diet.

## 2013-11-18 NOTE — Progress Notes (Signed)
PROGRESS NOTE  Subjective:   Olivia Russo is a 78 yo who was admitted last night with chest pain / tightness.   Had urgent cath by Dr. Eldridge Dace - minimal CAD.  Normal LV function  She is wheezing this am.      Objective:    Vital Signs:   Temp:  [97.7 F (36.5 C)-98.8 F (37.1 C)] 97.7 F (36.5 C) (09/21 0730) Pulse Rate:  [50-102] 52 (09/21 0700) Resp:  [10-23] 11 (09/21 0700) BP: (74-110)/(36-84) 80/46 mmHg (09/21 0700) SpO2:  [92 %-100 %] 100 % (09/21 0700) Weight:  [144 lb 2.9 oz (65.4 kg)-150 lb 2.1 oz (68.1 kg)] 150 lb 2.1 oz (68.1 kg) (09/21 0500)  Last BM Date: 11/17/13   24-hour weight change: Weight change:   Weight trends: Filed Weights   11/17/13 1700 11/18/13 0500  Weight: 144 lb 2.9 oz (65.4 kg) 150 lb 2.1 oz (68.1 kg)    Intake/Output:  09/20 0701 - 09/21 0700 In: 2035 [P.O.:300; I.V.:1235; IV Piggyback:500] Out: 400 [Urine:400]     Physical Exam: BP 80/46  Pulse 52  Temp(Src) 97.7 F (36.5 C) (Oral)  Resp 11  Ht  (1.575 m)  Wt 150 lb 2.1 oz (68.1 kg)  BMI 27.45 kg/m2  SpO2 100%  Wt Readings from Last 3 Encounters:  11/18/13 150 lb 2.1 oz (68.1 kg)  11/18/13 150 lb 2.1 oz (68.1 kg)    General: Vital signs reviewed and noted.   Head: Normocephalic, atraumatic.  Eyes: conjunctivae/corneas clear.  EOM's intact.   Throat: normal  Neck:  normal   Lungs:    bilateral tight wheezing,   Heart:  RR , normal S1S2  Abdomen:  Soft, non-tender, non-distended    Extremities: No edema, right femoral cath site looks great.   Neurologic: A&O X3, CN II - XII are grossly intact.   Psych: Normal     Labs: BMET:  Recent Labs  11/18/13 0540  NA 142  K 4.6  CL 110  CO2 24  GLUCOSE 96  BUN 20  CREATININE 0.87  CALCIUM 8.3*    Liver function tests:  Recent Labs  11/18/13 0540  AST 12  ALT 9  ALKPHOS 47  BILITOT 0.5  PROT 6.0  ALBUMIN 2.9*   No results found for this basename: LIPASE, AMYLASE,  in the last 72  hours  CBC:  Recent Labs  11/18/13 0540  WBC 6.4  HGB 11.7*  HCT 34.7*  MCV 87.8  PLT 195    Cardiac Enzymes:  Recent Labs  11/17/13 1825 11/17/13 2303 11/18/13 0540  TROPONINI <0.30 <0.30 <0.30    Coagulation Studies: No results found for this basename: LABPROT, INR,  in the last 72 hours  Other: No components found with this basename: POCBNP,  No results found for this basename: DDIMER,  in the last 72 hours No results found for this basename: HGBA1C,  in the last 72 hours  Recent Labs  11/18/13 0540  CHOL 146  HDL 75  LDLCALC 55  TRIG 78  CHOLHDL 1.9    Recent Labs  11/17/13 1825  TSH 0.779   No results found for this basename: VITAMINB12, FOLATE, FERRITIN, TIBC, IRON, RETICCTPCT,  in the last 72 hours   Other results:  EKG :  Nsr,  Minimal ST elevation   Medications:    Infusions: . sodium chloride 100 mL/hr at 11/17/13 1935    Scheduled Medications: . antiseptic oral rinse  7 mL Mouth  Rinse BID  . aspirin  81 mg Oral Daily  . atorvastatin  10 mg Oral q1800  . heparin  5,000 Units Subcutaneous 3 times per day  . indomethacin  50 mg Oral BID WC  . Influenza vac split quadrivalent PF  0.5 mL Intramuscular Tomorrow-1000  . pantoprazole  40 mg Oral Q0600  . sodium chloride  3 mL Intravenous Q12H    Assessment/ Plan:   Active Problems:   Intermediate coronary syndrome   HTN (hypertension)   Midsternal chest pain  1. Chest pain:  Her CP is atypical and she has no significant CAD, normal LV function, and normal troponin levels.  I think there CP is due to her bronchospasm. She is written for PRN nebs - will change to scheduled. Will start solumedrol  Have talked to Dr. Molli Knock who will see her.    Disposition: would like to transfer her to pulmonary team or internal medicine team.  She has no active cardiac issues.  Length of Stay: 1  Vesta Mixer, Montez Hageman., MD, [facility] 11/18/2013, 9:14 AM Office [phone] Pager [phone]

## 2013-11-18 NOTE — Progress Notes (Signed)
  Echocardiogram 2D Echocardiogram has been performed.  Cathie Beams 11/18/2013, 3:07 PM

## 2013-11-19 DIAGNOSIS — R072 Precordial pain: Secondary | ICD-10-CM

## 2013-11-19 DIAGNOSIS — R062 Wheezing: Secondary | ICD-10-CM

## 2013-11-19 MED ORDER — ALBUTEROL SULFATE (2.5 MG/3ML) 0.083% IN NEBU: 2.5000 mg | INHALATION_SOLUTION | RESPIRATORY_TRACT | Status: AC | PRN

## 2013-11-19 MED ORDER — PREDNISONE 20 MG PO TABS: ORAL_TABLET | ORAL | Status: AC

## 2013-11-19 MED ORDER — ENSURE COMPLETE PO LIQD: 237.0000 mL | Freq: Two times a day (BID) | ORAL | Status: AC

## 2013-11-19 MED ORDER — DOXYCYCLINE HYCLATE 100 MG PO TABS: 100.0000 mg | ORAL_TABLET | Freq: Two times a day (BID) | ORAL | Status: AC

## 2013-11-19 MED ORDER — ENSURE COMPLETE PO LIQD
237.0000 mL | Freq: Two times a day (BID) | ORAL | Status: DC
  Administered 2013-11-19: 237 mL via ORAL

## 2013-11-19 NOTE — Progress Notes (Signed)
All d/c instructions explained and given to pt by charge nurse at 1246.  D/c off floor to awaiting transport, escorted by her daughter.. Riley Hallum,RN.

## 2013-11-19 NOTE — Discharge Summary (Signed)
     DISCHARGE SUMMARY ADDENDUM   Pt stayed in hospital overnight due to lack of transportation to her home, family had just left to return to [place] When pt was cleared for discharge.  Today doing well, no complaints.  BP 93/47 p-63 R 18 T 98.6  No changes on exam.    2D Echo:  Study Conclusions  - Left ventricle: The cavity size was normal. Wall thickness was increased in a pattern of mild LVH. The estimated ejection fraction was 65%. Wall motion was normal; there were no regional wall motion abnormalities. - Right ventricle: The cavity size was normal. Systolic function was normal. - Pulmonary arteries: PA peak pressure: 39 mm Hg (S).   Discharge today with meds as described by Pulmonary.  I saw and evaluated the patient this morning. She was without any significant complaints. Wheezing seems to have improved. She says that she should go to get a ride for this morning is excited to go home.  She was admitted for chest tightness and concern for acute coronary syndrome. Cardiac catheterization revealed mild disease normal EF. Was consideration for possible pericarditis versus acute bronchitis. She was seen by pulmonary medicine yesterday he recommended is on taper with inhalers.  Please see the consultation note for recommendations.  On exam: BP 93/47  Pulse 63  Temp(Src) 98.6 F (37 C) (Oral)  Resp 18  Ht  (1.575 m)  Wt 147 lb 14.9 oz (67.1 kg)  BMI 27.05 kg/m2  SpO2 99% General appearance: alert, cooperative, no distress and Otherwise a well-appearing. Neck: no adenopathy, no carotid bruit, no JVD and supple, symmetrical, trachea midline Lungs: Minimal bilateral wheezing heard in upper airways, but not in the peripheral lung fields; nonlabored with reasonable air movement. Heart: regular rate and rhythm, S1, S2 normal, no murmur, click, rub or gallop and normal apical impulse Abdomen: soft, non-tender; bowel sounds normal; no masses,  no organomegaly Extremities:  extremities normal, atraumatic, no cyanosis or edema Pulses: 2+ and symmetric Neurologic: Grossly normal  She is stable for discharge with followup as already noted in the initial discharge summary.  Please see full discharge summary dated 11/18/2013 for full details.   Olivia Russo, M.D., M.S. Interventional Cardiologist   Pager # [phone]

## 2013-11-19 NOTE — Care Management Note (Addendum)
  Page 1 of 1   11/19/2013     2:29:05 PM CARE MANAGEMENT NOTE 11/19/2013  Patient:  Olivia Russo, Olivia Russo   Account Number:  [id number]  Date Initiated:  11/19/2013  Documentation initiated by:  Romelle Reiley  Subjective/Objective Assessment:   Code stemi     Action/Plan:   CM to follow for disposition needs   Anticipated DC Date:  11/19/2013   Anticipated DC Plan:  HOME/SELF CARE         Choice offered to / List presented to:             Status of service:  Completed, signed off Medicare Important Message given?  NA - LOS <3 / Initial given by admissions (If response is "NO", the following Medicare IM given date fields will be blank) Date Medicare IM given:   Medicare IM given by:   Date Additional Medicare IM given:   Additional Medicare IM given by:    Discharge Disposition:  HOME/SELF CARE  Per UR Regulation:  Reviewed for med. necessity/level of care/duration of stay  If discussed at Long Length of Stay Meetings, dates discussed:    Comments:  Nayleah Gamel RN, BSN, MSHL, CCM  Nurse - Case Manager,  (Unit [place])  [phone]  11/19/2013

## 2013-11-19 NOTE — Progress Notes (Signed)
S:   Pt reports improvement in shortness of breath now that she is back on her baseline meds  O:   Recent Labs Lab 11/18/13 0540  NA 142  K 4.6  CL 110  CO2 24  GLUCOSE 96  BUN 20  CREATININE 0.87  CALCIUM 8.3*    Recent Labs Lab 11/18/13 0540  HGB 11.7*  HCT 34.7*  WBC 6.4  PLT 195   General: elderly female in NAD Neuro: AAOx4, speech clear, MAE CV: s1s2 rrr, no m/r/g PULM: resp's even / non-labored, lungs bilaterally with significant improvement in wheezing (none on exam 9/22) GI: round/soft, bsx4 active Extremities: warm/dry, no edema   A:  AECOPD  P: Continue medications as outlined 9/21 for discharge Follow up with primary MD, Dr. Robynn Pane post discharge  Canary Brim, NP-C [facility] Pgr: [phone] or [phone]

## 2014-02-06 ENCOUNTER — Encounter (HOSPITAL_COMMUNITY): Payer: Self-pay | Admitting: Interventional Cardiology

## 2014-05-30 DEATH — deceased

## 2016-02-21 IMAGING — CR DG CHEST 1V PORT
1 series · 1 of 1 positions shown · non-contrast
Comparison: Radiograph 11/17/2013, CT 03/03/2013

CLINICAL DATA: Concern for pneumonia, short of breath

EXAM:
PORTABLE CHEST - 1 VIEW

[AP]
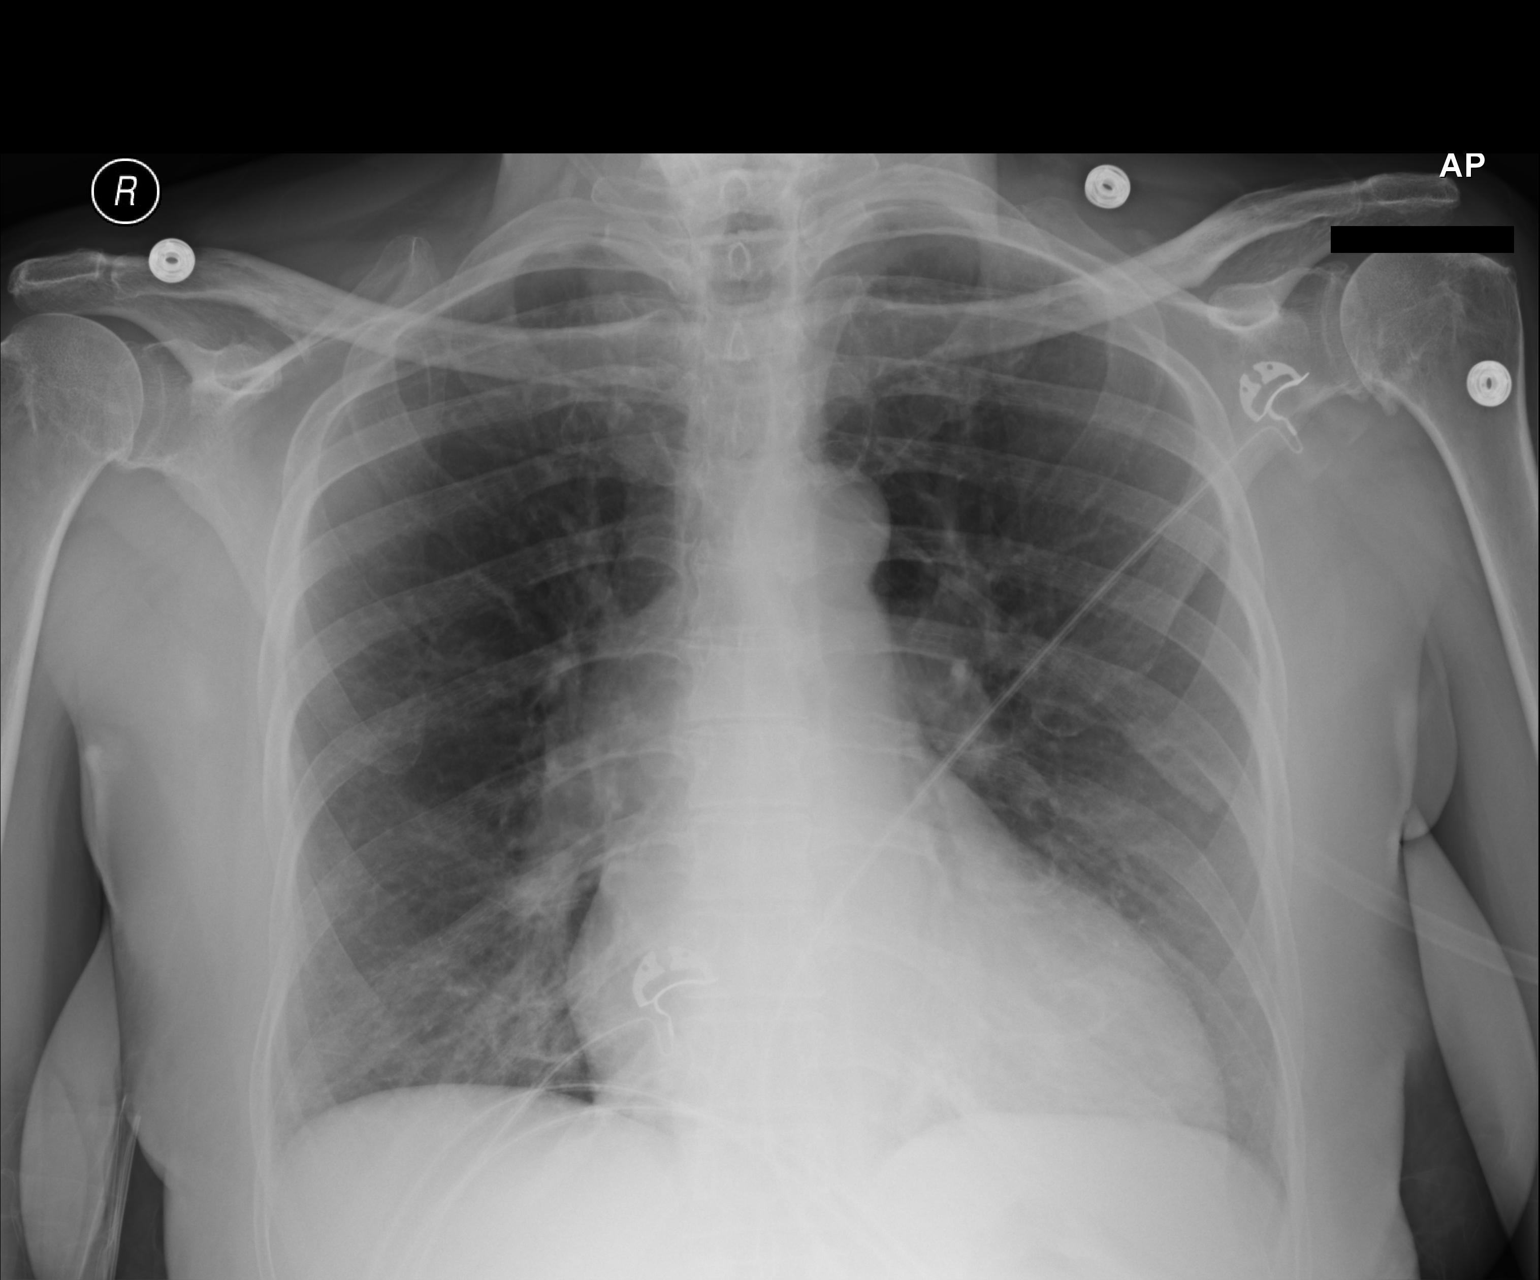

[1 of 1 positions shown; findings below may reference images not displayed]

FINDINGS: Normal mediastinum and cardiac silhouette. Normal pulmonary
vasculature. No evidence of effusion, infiltrate, or pneumothorax.
No acute bony abnormality.
IMPRESSION: No acute cardiopulmonary process.
